# Patient Record
Sex: Female | Born: 1965 | Race: Black or African American | Hispanic: No | Marital: Married | Smoking: Never smoker
Health system: Southern US, Community
[De-identification: ages and names within clinical notes are randomized; demographics above are authoritative.]

## PROBLEM LIST (undated history)

## (undated) DIAGNOSIS — Z9079 Acquired absence of other genital organ(s): Secondary | ICD-10-CM

## (undated) DIAGNOSIS — N898 Other specified noninflammatory disorders of vagina: Secondary | ICD-10-CM

## (undated) DIAGNOSIS — Z90722 Acquired absence of ovaries, bilateral: Secondary | ICD-10-CM

## (undated) DIAGNOSIS — D649 Anemia, unspecified: Secondary | ICD-10-CM

## (undated) DIAGNOSIS — Z90710 Acquired absence of both cervix and uterus: Secondary | ICD-10-CM

## (undated) DIAGNOSIS — N939 Abnormal uterine and vaginal bleeding, unspecified: Secondary | ICD-10-CM

## (undated) HISTORY — PX: CHOLECYSTECTOMY: SHX55

## (undated) HISTORY — DX: Acquired absence of other genital organ(s): Z90.79

## (undated) HISTORY — DX: Anemia, unspecified: D64.9

## (undated) HISTORY — DX: Abnormal uterine and vaginal bleeding, unspecified: N93.9

## (undated) HISTORY — DX: Acquired absence of both cervix and uterus: Z90.710

## (undated) HISTORY — DX: Acquired absence of ovaries, bilateral: Z90.722

## (undated) HISTORY — DX: Other specified noninflammatory disorders of vagina: N89.8

---

## 2000-03-27 ENCOUNTER — Emergency Department (HOSPITAL_COMMUNITY): Admission: EM | Admit: 2000-03-27 | Discharge: 2000-03-28 | Payer: Self-pay | Admitting: Emergency Medicine

## 2000-03-28 ENCOUNTER — Encounter: Payer: Self-pay | Admitting: Emergency Medicine

## 2000-10-23 ENCOUNTER — Emergency Department (HOSPITAL_COMMUNITY): Admission: EM | Admit: 2000-10-23 | Discharge: 2000-10-23 | Payer: Self-pay | Admitting: Emergency Medicine

## 2000-11-24 ENCOUNTER — Ambulatory Visit: Admission: RE | Admit: 2000-11-24 | Discharge: 2000-11-24 | Payer: Self-pay | Admitting: General Surgery

## 2001-08-04 ENCOUNTER — Encounter: Admission: RE | Admit: 2001-08-04 | Discharge: 2001-08-04 | Payer: Self-pay | Admitting: General Surgery

## 2001-08-04 ENCOUNTER — Encounter: Payer: Self-pay | Admitting: General Surgery

## 2005-02-21 ENCOUNTER — Emergency Department (HOSPITAL_COMMUNITY): Admission: EM | Admit: 2005-02-21 | Discharge: 2005-02-21 | Payer: Self-pay | Admitting: Emergency Medicine

## 2005-03-12 ENCOUNTER — Emergency Department: Payer: Self-pay | Admitting: General Practice

## 2005-05-12 ENCOUNTER — Ambulatory Visit: Payer: Self-pay | Admitting: Family Medicine

## 2008-05-15 ENCOUNTER — Ambulatory Visit: Payer: Self-pay

## 2010-10-06 ENCOUNTER — Encounter: Payer: Self-pay | Admitting: Family Medicine

## 2012-03-12 ENCOUNTER — Emergency Department: Payer: Self-pay | Admitting: Emergency Medicine

## 2012-03-12 LAB — BASIC METABOLIC PANEL
Anion Gap: 6 — ABNORMAL LOW (ref 7–16)
BUN: 19 mg/dL — ABNORMAL HIGH (ref 7–18)
Calcium, Total: 8.3 mg/dL — ABNORMAL LOW (ref 8.5–10.1)
Chloride: 106 mmol/L (ref 98–107)
Co2: 26 mmol/L (ref 21–32)
Creatinine: 0.75 mg/dL (ref 0.60–1.30)
EGFR (African American): >60
EGFR (Non-African Amer.): >60
Glucose: 76 mg/dL (ref 65–99)
Osmolality: 277 (ref 275–301)
Potassium: 3.7 mmol/L (ref 3.5–5.1)
Sodium: 138 mmol/L (ref 136–145)

## 2012-03-12 LAB — CBC
HCT: 27.1 % — ABNORMAL LOW (ref 35.0–47.0)
HGB: 8.1 g/dL — ABNORMAL LOW (ref 12.0–16.0)
MCH: 20 pg — ABNORMAL LOW (ref 26.0–34.0)
MCHC: 29.9 g/dL — ABNORMAL LOW (ref 32.0–36.0)
MCV: 67 fL — ABNORMAL LOW (ref 80–100)
Platelet: 300 10*3/uL (ref 150–440)
RBC: 4.06 10*6/uL (ref 3.80–5.20)
RDW: 17.8 % — ABNORMAL HIGH (ref 11.5–14.5)
WBC: 3.2 10*3/uL — ABNORMAL LOW (ref 3.6–11.0)

## 2012-03-12 LAB — LIPASE, BLOOD: Lipase: 115 U/L (ref 73–393)

## 2012-03-12 LAB — TROPONIN I: Troponin-I: <0.02 ng/mL

## 2013-11-11 IMAGING — CT CT CHEST W/ CM
1 series · 15 of 33 positions shown, 19 images · non-contrast
Comparison: none

REASON FOR EXAM: + d dimer
COMMENTS:

[Series 5: soft tissue · axial · 0.69mm/px · z∈[-216,+38]mm · 15 of 101 slices shown, 19 images]
[im 8/101  mediastinal]
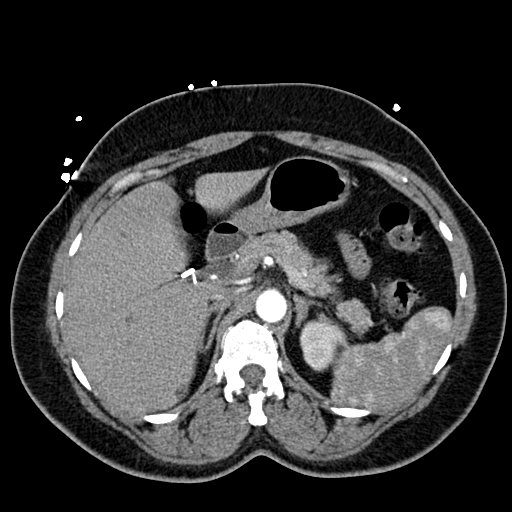
[im 8/101  lung]
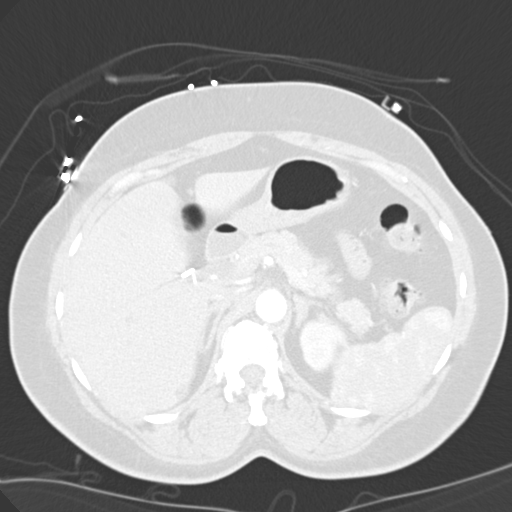
[im 15/101  lung]
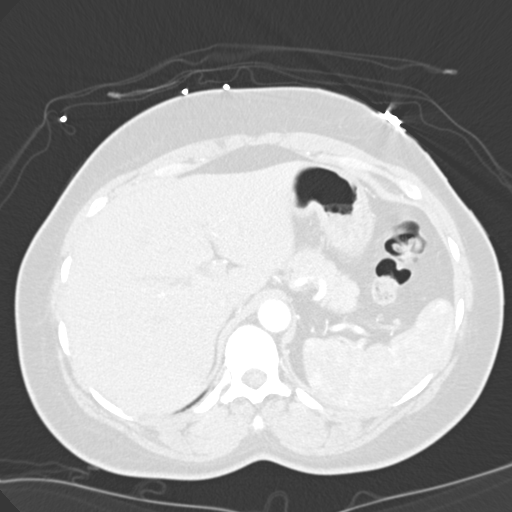
[im 21/101  lung]
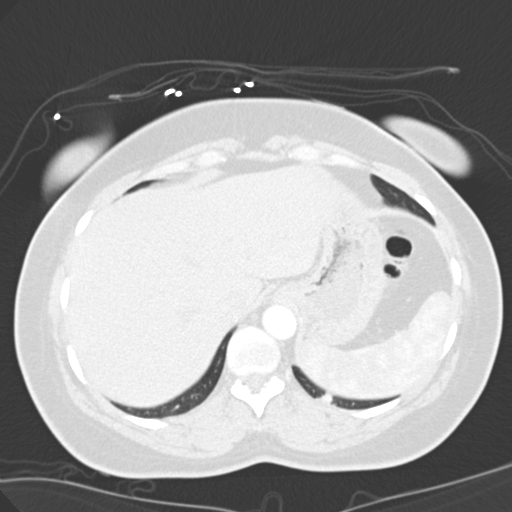
[im 26/101  lung]
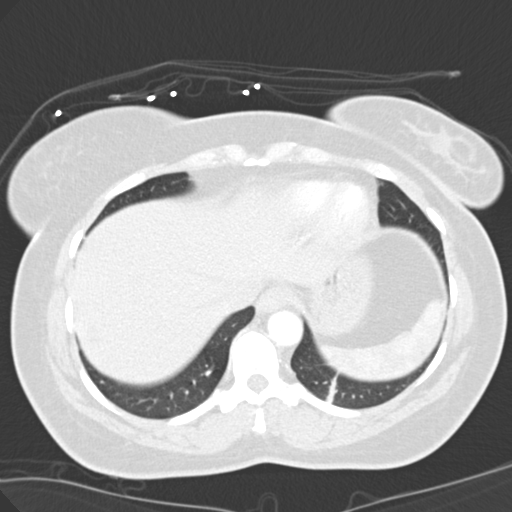
[im 34/101  mediastinal]
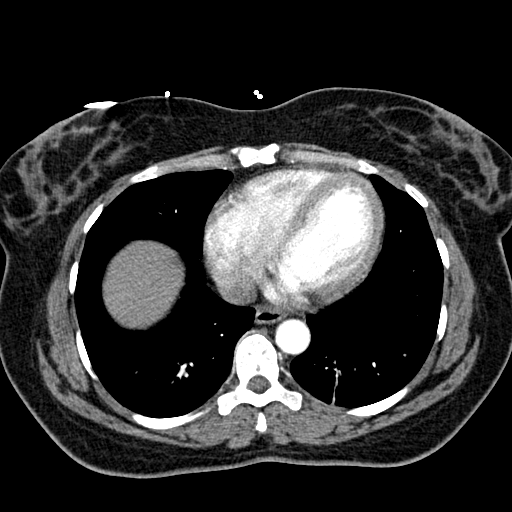
[im 34/101  lung]
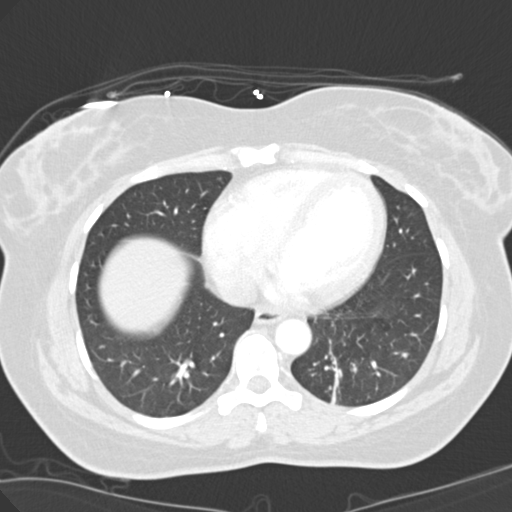
[im 41/101  lung]
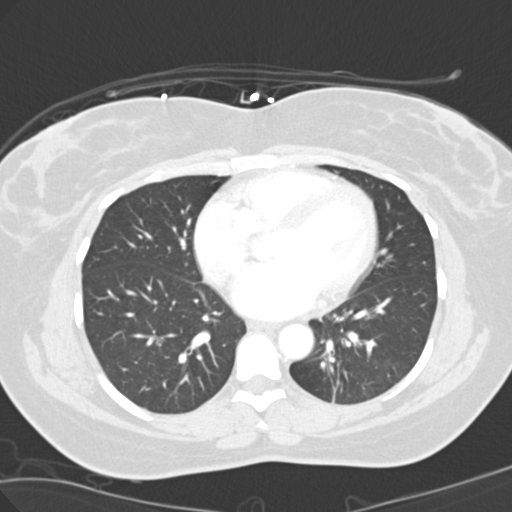
[im 48/101  lung]
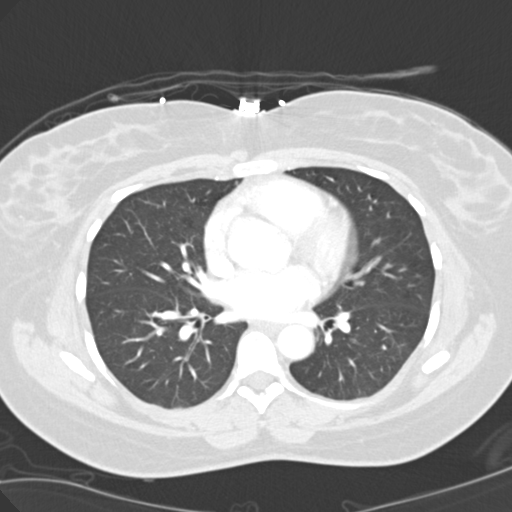
[im 52/101  lung]
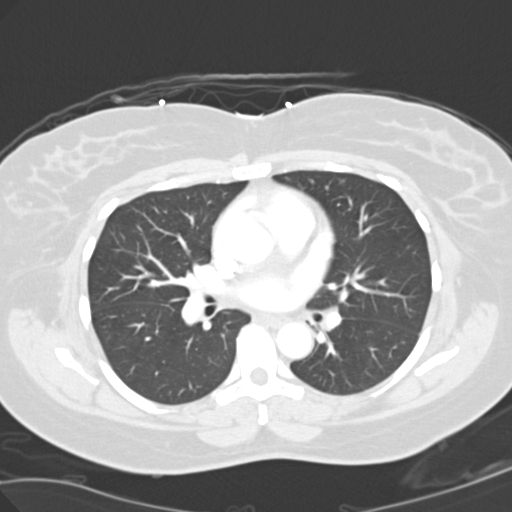
[im 56/101  mediastinal]
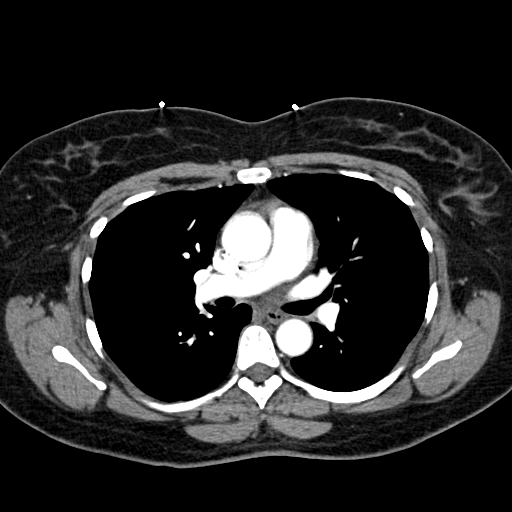
[im 56/101  lung]
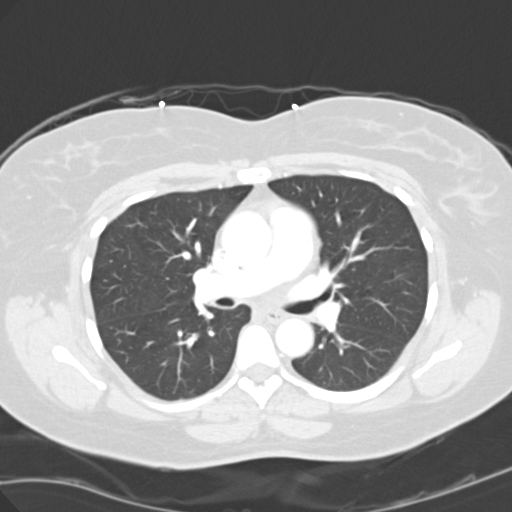
[im 61/101  lung]
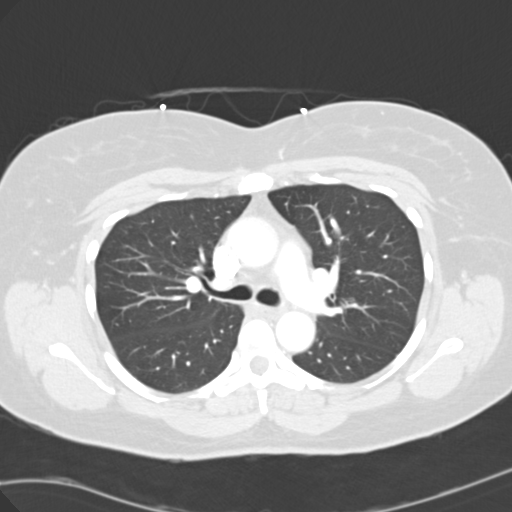
[im 67/101  lung]
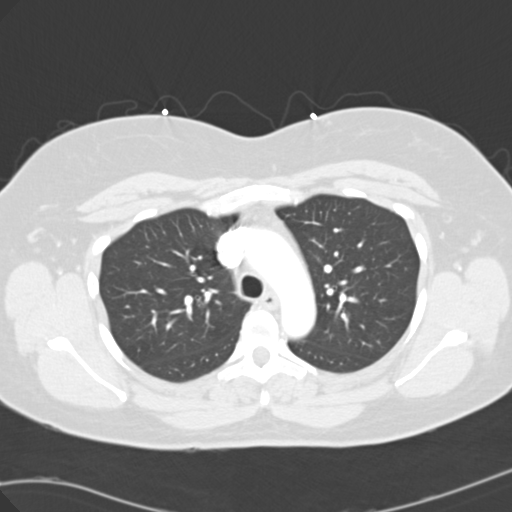
[im 75/101  lung]
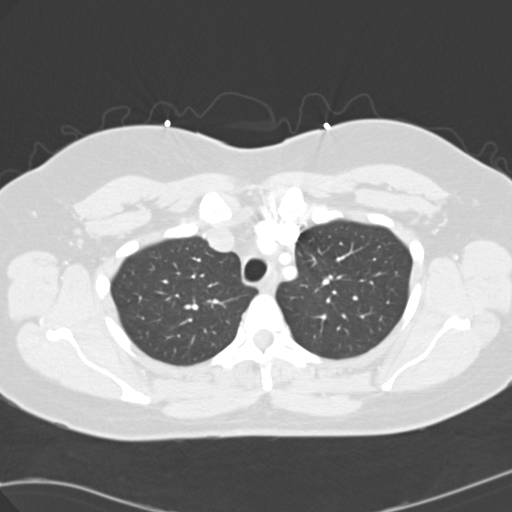
[im 81/101  mediastinal]
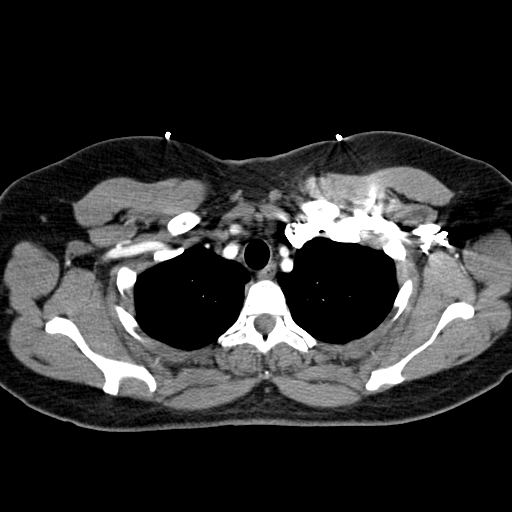
[im 81/101  lung]
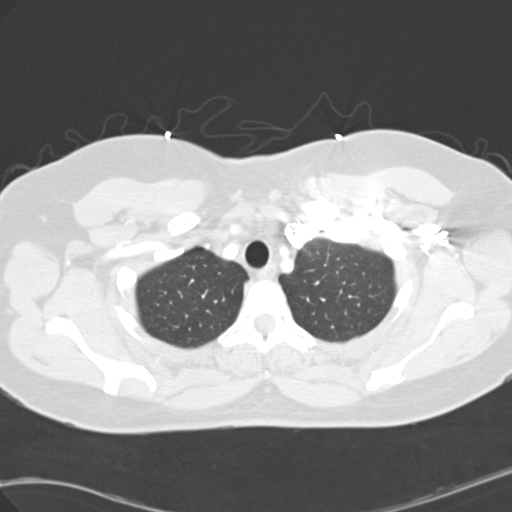
[im 86/101  lung]
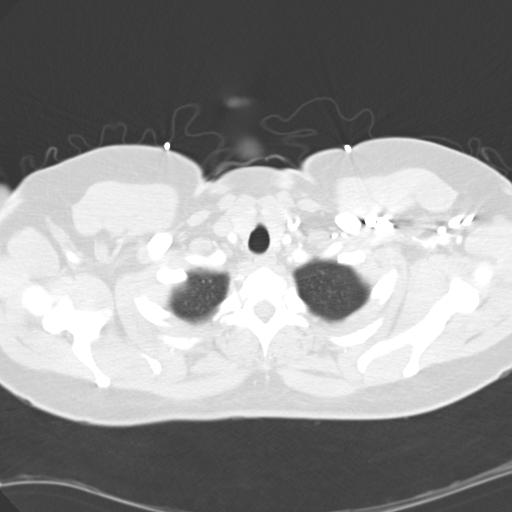
[im 93/101  lung]
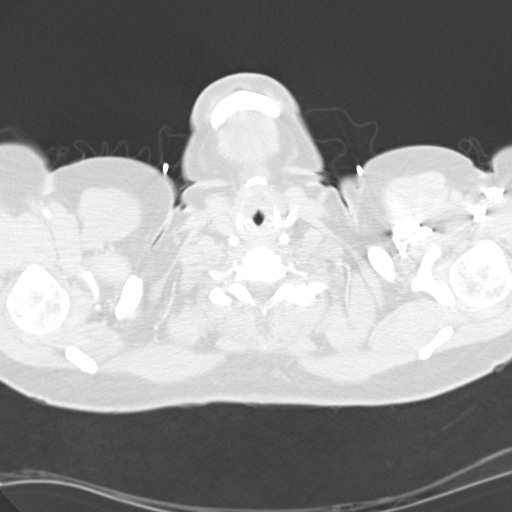

[15 of 33 positions shown; findings below may reference images not displayed]

PROCEDURE:     CT  - CT CHEST (FOR PE) W  - March 12, 2012  [DATE]

RESULT:     Axial CT scanning was performed through the chest with
reconstructions at 3 mm intervals and slice thicknesses. Review of
multiplanar reconstructed images was performed separately on the VIA
monitor. The patient received 75 cc of [id number]. Comparison is made to a
prior study dated 15 May, 2008.

At mediastinal window settings the cardiac chambers are borderline enlarged.
The caliber of the thoracic aorta is normal. Contrast within the pulmonary
arterial tree is normal in appearance. I see no filling defects to suggest
an acute pulmonary embolism. There are no pathologic sized mediastinal or
hilar lymph nodes. The thoracic esophagus is normal in caliber. There is no
pleural nor pericardial effusion.

At lung window settings there is new linear density in the posterior
costophrenic gutter on the left. I see no alveolar infiltrates. No pulmonary
parenchymal masses are demonstrated.

Within the upper abdomen the observed portions of the liver and spleen and
adrenal glands exhibit no acute abnormality. There are stable hypodensities
in the right hepatic lobe most compatible with cysts. The thoracic vertebral
bodies are preserved in height.
IMPRESSION: 1. I do not see evidence of an acute pulmonary embolism. There is linear
atelectatic change in the left lower lobe posteriorly which is new.
2. There is no evidence of CHF nor of pneumonia. I see no mediastinal or
hilar lymphadenopathy nor evidence of pleural effusions.
3. There are stable hypodensities in the liver most compatible with cysts.

[REDACTED]

## 2014-02-19 ENCOUNTER — Inpatient Hospital Stay: Payer: Self-pay | Admitting: Obstetrics and Gynecology

## 2014-02-19 LAB — COMPREHENSIVE METABOLIC PANEL
Albumin: 3.2 g/dL — ABNORMAL LOW (ref 3.4–5.0)
Alkaline Phosphatase: 34 U/L — ABNORMAL LOW
Anion Gap: 6 — ABNORMAL LOW (ref 7–16)
BUN: 13 mg/dL (ref 7–18)
Bilirubin,Total: 0.3 mg/dL (ref 0.2–1.0)
Calcium, Total: 8 mg/dL — ABNORMAL LOW (ref 8.5–10.1)
Chloride: 106 mmol/L (ref 98–107)
Co2: 27 mmol/L (ref 21–32)
Creatinine: 0.71 mg/dL (ref 0.60–1.30)
EGFR (African American): >60
EGFR (Non-African Amer.): >60
Glucose: 102 mg/dL — ABNORMAL HIGH (ref 65–99)
Osmolality: 278 (ref 275–301)
Potassium: 3.3 mmol/L — ABNORMAL LOW (ref 3.5–5.1)
SGOT(AST): 12 U/L — ABNORMAL LOW (ref 15–37)
SGPT (ALT): 23 U/L (ref 12–78)
Sodium: 139 mmol/L (ref 136–145)
Total Protein: 6.8 g/dL (ref 6.4–8.2)

## 2014-02-19 LAB — CBC
HCT: 16 % — ABNORMAL LOW (ref 35.0–47.0)
HGB: 4.5 g/dL — CL (ref 12.0–16.0)
MCH: 16.5 pg — ABNORMAL LOW (ref 26.0–34.0)
MCHC: 28.2 g/dL — ABNORMAL LOW (ref 32.0–36.0)
MCV: 59 fL — ABNORMAL LOW (ref 80–100)
Platelet: 274 10*3/uL (ref 150–440)
RBC: 2.72 10*6/uL — ABNORMAL LOW (ref 3.80–5.20)
RDW: 22.3 % — ABNORMAL HIGH (ref 11.5–14.5)
WBC: 3 10*3/uL — ABNORMAL LOW (ref 3.6–11.0)

## 2014-02-19 LAB — HCG, QUANTITATIVE, PREGNANCY: Beta Hcg, Quant.: <1 m[IU]/mL — ABNORMAL LOW

## 2014-02-19 LAB — PROTIME-INR
INR: 0.9
Prothrombin Time: 12.5 secs (ref 11.5–14.7)

## 2014-02-20 LAB — HEMOGLOBIN
HGB: 5 g/dL — CL (ref 12.0–16.0)
HGB: 5.6 g/dL — AB (ref 12.0–16.0)
HGB: 7.7 g/dL — ABNORMAL LOW (ref 12.0–16.0)

## 2014-02-20 LAB — URINALYSIS, COMPLETE
Bacteria: NONE SEEN
Bilirubin,UR: NEGATIVE
Glucose,UR: NEGATIVE mg/dL (ref 0–75)
KETONE: NEGATIVE
LEUKOCYTE ESTERASE: NEGATIVE
NITRITE: NEGATIVE
Ph: 6 (ref 4.5–8.0)
Protein: NEGATIVE
RBC,UR: 13 /HPF (ref 0–5)
SPECIFIC GRAVITY: 1.014 (ref 1.003–1.030)
Squamous Epithelial: 1
WBC UR: 3 /HPF (ref 0–5)

## 2014-02-21 LAB — HEMOGLOBIN: HGB: 7.1 g/dL — ABNORMAL LOW (ref 12.0–16.0)

## 2014-02-21 LAB — BASIC METABOLIC PANEL
Anion Gap: 3 — ABNORMAL LOW (ref 7–16)
BUN: 11 mg/dL (ref 7–18)
CHLORIDE: 111 mmol/L — AB (ref 98–107)
CREATININE: 0.65 mg/dL (ref 0.60–1.30)
Calcium, Total: 7.8 mg/dL — ABNORMAL LOW (ref 8.5–10.1)
Co2: 26 mmol/L (ref 21–32)
Glucose: 86 mg/dL (ref 65–99)
Osmolality: 278 (ref 275–301)
POTASSIUM: 3.9 mmol/L (ref 3.5–5.1)
Sodium: 140 mmol/L (ref 136–145)

## 2014-02-21 LAB — GC/CHLAMYDIA PROBE AMP

## 2014-02-27 LAB — HM PAP SMEAR: HM PAP: NEGATIVE

## 2014-04-02 ENCOUNTER — Ambulatory Visit: Payer: Self-pay | Admitting: Obstetrics and Gynecology

## 2014-04-02 DIAGNOSIS — D508 Other iron deficiency anemias: Secondary | ICD-10-CM

## 2014-04-02 LAB — CBC WITH DIFFERENTIAL/PLATELET
BASOS ABS: 0.1 10*3/uL (ref 0.0–0.1)
Basophil %: 2 %
EOS ABS: 0 10*3/uL (ref 0.0–0.7)
EOS PCT: 1.5 %
HCT: 33.4 % — AB (ref 35.0–47.0)
HGB: 10.8 g/dL — ABNORMAL LOW (ref 12.0–16.0)
LYMPHS PCT: 40.8 %
Lymphocyte #: 1.2 10*3/uL (ref 1.0–3.6)
MCH: 28.2 pg (ref 26.0–34.0)
MCHC: 32.4 g/dL (ref 32.0–36.0)
MCV: 87 fL (ref 80–100)
Monocyte #: 0.2 x10 3/mm (ref 0.2–0.9)
Monocyte %: 7.1 %
NEUTROS PCT: 48.6 %
Neutrophil #: 1.4 10*3/uL (ref 1.4–6.5)
Platelet: 274 10*3/uL (ref 150–440)
RBC: 3.83 10*6/uL (ref 3.80–5.20)
RDW: 23.7 % — ABNORMAL HIGH (ref 11.5–14.5)
WBC: 3 10*3/uL — ABNORMAL LOW (ref 3.6–11.0)

## 2014-04-02 LAB — BASIC METABOLIC PANEL
ANION GAP: 2 — AB (ref 7–16)
BUN: 23 mg/dL — ABNORMAL HIGH (ref 7–18)
CREATININE: 0.76 mg/dL (ref 0.60–1.30)
Calcium, Total: 8.7 mg/dL (ref 8.5–10.1)
Chloride: 109 mmol/L — ABNORMAL HIGH (ref 98–107)
Co2: 29 mmol/L (ref 21–32)
EGFR (Non-African Amer.): >60
GLUCOSE: 84 mg/dL (ref 65–99)
Osmolality: 282 (ref 275–301)
Potassium: 4.3 mmol/L (ref 3.5–5.1)
SODIUM: 140 mmol/L (ref 136–145)

## 2014-04-08 ENCOUNTER — Inpatient Hospital Stay: Payer: Self-pay | Admitting: Obstetrics and Gynecology

## 2014-04-09 LAB — CREATININE, SERUM: CREATININE: 0.74 mg/dL (ref 0.60–1.30)

## 2014-04-09 LAB — HEMOGLOBIN: HGB: 10.4 g/dL — ABNORMAL LOW (ref 12.0–16.0)

## 2014-04-10 HISTORY — PX: ABDOMINAL HYSTERECTOMY: SHX81

## 2014-04-11 LAB — PATHOLOGY REPORT

## 2014-04-11 LAB — URINE CULTURE

## 2015-03-01 NOTE — Op Note (Signed)
PATIENT NAME:  Makayla DredgeKIMBLE, Garrie MR#:  [id number] DATE OF BIRTH:  [birth year]  DATE OF PROCEDURE:  04/08/2014  PREOPERATIVE DIAGNOSES: 1.  Perimenopausal menorrhagia. 2.  Anemia. 3.  Fibroid uterus.    POSTOPERATIVE DIAGNOSES: 1.  Perimenopausal menorrhagia. 2.  Anemia. 3.  Fibroid uterus.    OPERATION:  Total abdominal hysterectomy with bilateral salpingo-oophorectomy.   ANESTHESIA:  General endotracheal.    SURGEON:  Mackenna Kamer S. Valentino Saxonherry, MD  ASSISTANT:  Prentice DockerMartin A. DeFrancesco, MD; Alfredo MartinezJustin Ollis, PA student.  EBL:  250 mL.  OPERATIVE FLUIDS:  1350 mL of lactated Ringer's; Urine output 175 ml.    COMPLICATIONS:  None.  FINDINGS:  A large leiomyomatous uterus par 20 cm, normal-appearing bilateral fallopian tubes and ovaries, small 2 cm simple left ovarian cyst.  SPECIMENS:  Uterus, bilateral tubes and ovaries.    PROCEDURE:  The patient was taken to the operating room where she was placed under general anesthesia without difficulty.  She was then placed in the supine position and prepped and draped in the normal sterile fashion.  After this a Foley catheter was placed.    An infraumbilical midline incision was made anterior to the subcutaneous fascia.  Fascial incision was made and extended vertically.  The rectus muscles were then dissected off of 1 side.  The peritoneum was then identified and entered and the peritoneal incision was extended longitudinally.  The above findings were noted.  The uterus was delivered from the abdomen and bowel was packed away from the surgical site.  The round ligaments were identified, cut and ligated with 3-0 Vicryl.  Anterior peritoneal reflection was incised and the bladder was dissected off the lower uterine segment.  The retroperitoneal space was then explored and ureters were able to be identified bilaterally.  The right infundibulopelvic ligament was grasped, cut and suture ligated with 0 Vicryl.  The left infundibulopelvic ligament was then grasped, cut  and suggesting ligated with 0 Vicryl in a similar fashion.  Hemostasis was observed.  The uterine vessels were then skeletonized, clamped, cut and suture ligated with 0 Vicryl suture.  Serial pedicles of the cardinal and uterosacral ligaments were clamped, cut and suture ligated with 0 Vicryl on each side.  Entrance was then made into the vagina and the uterus was removed.  Vaginal cuff angled sutures were then placed incorporating the uterosacral ligaments for support.  The vaginal cuff was then closed with several figure-of-eight sutures of 0 Vicryl and lavage was carried out until clear.  Hemostasis was observed.  Surgicel was also placed on the  vaginal cuff for added hemostasis.    All packing was removed from the abdomen.  The fascia was approximated with two running sutures of 0 Maxon.  Lavage was then again carried out and hemostasis was observed.  The subcutaneous fat tissue layer was closed with a 2-0 Vicryl in a running fashion.  The skin was then approximated with staples.  A Lidoderm patch was then placed over the incision.  Instrument, sponge and needle counts were correct prior to abdominal closure and at the conclusion of the case.  The patient was sent to the PACU in stable condition.    ____________________________ Jacques EarthlyAnika S. Valentino Saxonherry, MD asc:cs D: 04/09/2014 19:43:00 ET T: 04/09/2014 20:15:19 ET JOB#: [id number]  cc: Jacques EarthlyAnika S. Valentino Saxonherry, MD, <Dictator> Fabian NovemberANIKA S Shandelle Borrelli MD ELECTRONICALLY SIGNED 04/22/2014 10:28

## 2015-07-29 ENCOUNTER — Encounter: Payer: Self-pay | Admitting: Obstetrics and Gynecology

## 2015-09-03 ENCOUNTER — Encounter: Payer: Self-pay | Admitting: Obstetrics and Gynecology

## 2015-09-03 ENCOUNTER — Ambulatory Visit (INDEPENDENT_AMBULATORY_CARE_PROVIDER_SITE_OTHER): Payer: Self-pay | Admitting: Obstetrics and Gynecology

## 2015-09-03 VITALS — BP 127/84 | HR 87 | Resp 16 | Ht 69.0 in | Wt 215.6 lb

## 2015-09-03 DIAGNOSIS — N950 Postmenopausal bleeding: Secondary | ICD-10-CM

## 2015-09-03 DIAGNOSIS — Z1239 Encounter for other screening for malignant neoplasm of breast: Secondary | ICD-10-CM

## 2015-09-03 DIAGNOSIS — E669 Obesity, unspecified: Secondary | ICD-10-CM

## 2015-09-03 DIAGNOSIS — N951 Menopausal and female climacteric states: Secondary | ICD-10-CM

## 2015-09-03 DIAGNOSIS — Z01419 Encounter for gynecological examination (general) (routine) without abnormal findings: Secondary | ICD-10-CM

## 2015-09-03 MED ORDER — ESTROGENS CONJUGATED 0.625 MG PO TABS: 0.6250 mg | ORAL_TABLET | Freq: Every day | ORAL | Status: AC

## 2015-09-03 NOTE — Progress Notes (Signed)
GYNECOLOGY ANNUAL PHYSICAL EXAM  Subjective:    Makayla Rios is a 49 y.o. female who presents for annual exam. The patient has no complaints today. The patient is sexually active. GYN screening history: last pap: approximate date 02/2014 and was normal and last mammogram: approximate date 08/2014 and was normal. The patient is not taking hormone replacement therapy. Patient reports post-menopausal vaginal bleeding (spotting). Vaginal bleeding has been going on for 2 months, is intermittent, bright red. Bleeding is not associated with pain. Also reports hot flushes, night sweats, and vaginal dryness. The patient wears seatbelts: yes. The patient participates in regular exercise: no. Has the patient ever been transfused or tattooed?: yes. The patient reports that there is not domestic violence in her life.   Menstrual History: OB History  Gravida Para Term Preterm AB SAB TAB Ectopic Multiple Living  5 5 5       5     # Outcome Date GA Lbr Len/2nd Weight Sex Delivery Anes PTL Lv  5 Term      Vag-Spont   Y  4 Term      Vag-Spont   Y  3 Term      Vag-Spont   Y  2 Term      Vag-Spont   Y  1 Term      Vag-Spont   Y      Menarche age: [age] No LMP recorded. Patient has had a hysterectomy.  Colonoscopy performed 01/2015, normal.   Past Medical History  Diagnosis Date  . Anemia     Past Surgical History  Procedure Laterality Date  . Cholecystectomy    . Abdominal hysterectomy  04/10/2014    with bso    Family History  Problem Relation Age of Onset  . Diabetes Mother   . Cancer Neg Hx   . Heart disease Neg Hx     Social History   Social History  . Marital Status: Married    Spouse Name: N/A  . Number of Children: N/A  . Years of Education: N/A   Occupational History  . Not on file.   Social History Main Topics  . Smoking status: Never Smoker   . Smokeless tobacco: Not on file  . Alcohol Use: Yes  . Drug Use: No  . Sexual Activity: Yes    Birth Control/ Protection: Surgical    Other Topics Concern  . Not on file   Social History Narrative   Outpatient Encounter Prescriptions as of 09/03/2015  Medication Sig Note  . Multiple Vitamin (MULTIVITAMIN) capsule Take by mouth. 09[facility]03/2015[facility]   No facility-administered encounter medications on file as of 09/03/2015.    No Known Allergies   Review of Systems  Constitutional: positive for hot flushes and night sweats; negative for chills, fatigue, fevers Eyes: negative for irritation, redness and visual disturbance Ears, nose, mouth, throat, and face: negative for hearing loss, nasal congestion, snoring and tinnitus Respiratory: negative for asthma, cough, sputum Cardiovascular: negative for chest pain, dyspnea, exertional chest pressure/discomfort, irregular heart beat, palpitations and syncope Gastrointestinal: negative for abdominal pain, change in bowel habits, nausea and vomiting Genitourinary: positive for vaginal dryness, vaginal bleeding; negative for abnormal menstrual periods, genital lesions, sexual problems and vaginal discharge, dysuria and urinary incontinence Integument/breast: negative for breast lump, breast tenderness and nipple discharge Hematologic/lymphatic: negative for bleeding and easy bruising Musculoskeletal:negative for back pain and muscle weakness Neurological: negative for dizziness, headaches, vertigo and weakness Endocrine: negative for diabetic symptoms including polydipsia,  polyuria and skin dryness Allergic/Immunologic: negative for hay fever and urticaria    Objective:    BP 127/84 mmHg  Pulse 87  Resp 16  Ht  (1.753 m)  Wt 215 lb 9.6 oz (97.796 kg)  BMI 31.82 kg/m2  General Appearance:    Alert, cooperative, no distress, appears stated age  Head:    Normocephalic, without obvious abnormality, atraumatic  Eyes:    PERRL, conjunctiva/corneas clear, EOM's intact, both eyes  Ears:    Normal external ear canals, both ears   Nose:   Nares normal, septum midline, mucosa normal, no drainage or sinus tenderness  Throat:   Lips, mucosa, and tongue normal; teeth and gums normal  Neck:   Supple, symmetrical, trachea midline, no adenopathy;  thyroid: no enlargement/tenderness/nodules; no carotid bruit or JVD  Back:     Symmetric, no curvature, ROM normal, no CVA tenderness  Lungs:     Clear to auscultation bilaterally, respirations unlabored  Chest Wall:    No tenderness or deformity   Heart:    Regular rate and rhythm, S1 and S2 normal, no murmur, rub   or gallop  Breast Exam:    No tenderness, masses, or nipple abnormality. Thickening of axillary tissue bilaterally.   Abdomen:     Soft, non-tender, bowel sounds active all four quadrants, no masses, no organomegaly.  Well healed infraumbilical vertical midline incision.  Genitalia:    Female genitalia: Bladder no bladder distension noted Urethra: not indicated and normal appearing urethra with no masses, tenderness or lesions Vulva: normal appearing vulva with no masses, tenderness or lesions Vagina: atrophic and with small area of granulation tissue noted at left apical segment of vagina, friable Cervix: surgically absent Uterus: surgically absent, vaginal cuff well healed Adnexa: surgically absent bilateral  Rectal:    Normal external sphincter and tone.  No hemorrhoids.  Internal exam not performed.   Extremities:   Extremities normal, atraumatic, no cyanosis or edema  Pulses:   2+ and symmetric all extremities  Skin:   Skin color, texture, turgor normal, no rashes or lesions  Lymph nodes:   Cervical, supraclavicular, and axillary nodes normal  Neurologic:   CNII-XII intact, normal strength, sensation and reflexes    throughout      Last Labs:  Lab Results  Component Value Date   WBC 3.0* 04/02/2014   HGB 10.4* 04/09/2014   HCT 33.4* 04/02/2014   MCV 87 04/02/2014   PLT 274 04/02/2014     Chemistry      Component Value Date/Time   NA 140 04/02/2014  1139   K 4.3 04/02/2014 1139   CL 109* 04/02/2014 1139   CO2 29 04/02/2014 1139   BUN 23* 04/02/2014 1139   CREATININE 0.74 04/09/2014 0527      Component Value Date/Time   CALCIUM 8.7 04/02/2014 1139   ALKPHOS 34* 02/19/2014 1528   AST 12* 02/19/2014 1528   ALT 23 02/19/2014 1528   BILITOT 0.3 02/19/2014 1528      02/2014:  TSH - 0.709 (normal) Lipid panel - normal HgbA1c 6.0 (slightly elevated)   Assessment:    Normal gyn exam Menopause Post-menopausal vaginal bleeding   Obesity Class 1  H/o anemia (secondary to fibroid uterus and perimenopausal bleeding, now s/p hysterectomy)  Plan:    Breast self exam technique reviewed and patient encouraged to perform self-exam monthly. Discussed healthy lifestyle modifications. Mammogram.   Labs ordered: Lipid panel, CBC, HgbA1c, and BMP.  Treated area of granulation tissue with silver  nitrate stick. Should have no further bleeding.  Vasomotor symptoms and vaginal atrophy - Patient with bothersome menopausal vasomotor symptoms. Discussed lifestyle interventions such as wearing light clothing, remaining in cool environments, having fan/air conditioner in the room, avoiding hot beverages etc.  Discussed using hormone therapy and concerns about increased risk of heart disease, cerebrovascular disease, thromboembolic disease,  and breast cancer.  Also discussed other medical options such as Paxil, Effexor or Neurontin.   Also discussed alternative therapies such as herbal remedies but cautioned that most of the products contained phytoestrogens (plant estrogens) in unregulated amounts which can have the same effects on the body as the pharmaceutical estrogen preparations. Patient opted for estrogen therapy for now, wants to try the oral formulation.  Premarin 0.625 mg daily prescribed.  She will return in 3 months for reevaluation. To f/u in 1 year for annual exam.   Hildred Laser, MD [facility]

## 2015-09-04 LAB — CBC
Hematocrit: 38 % (ref 34.0–46.6)
Hemoglobin: 12.7 g/dL (ref 11.1–15.9)
MCH: 29.4 pg (ref 26.6–33.0)
MCHC: 33.4 g/dL (ref 31.5–35.7)
MCV: 88 fL (ref 79–97)
PLATELETS: 260 10*3/uL (ref 150–379)
RBC: 4.32 x10E6/uL (ref 3.77–5.28)
RDW: 13.1 % (ref 12.3–15.4)
WBC: 3.1 10*3/uL — AB (ref 3.4–10.8)

## 2015-09-04 LAB — BASIC METABOLIC PANEL
BUN / CREAT RATIO: 20 (ref 9–23)
BUN: 15 mg/dL (ref 6–24)
CO2: 26 mmol/L (ref 18–29)
CREATININE: 0.74 mg/dL (ref 0.57–1.00)
Calcium: 9.3 mg/dL (ref 8.7–10.2)
Chloride: 100 mmol/L (ref 97–106)
GFR calc Af Amer: 108 mL/min/{1.73_m2} (ref >59)
GFR, EST NON AFRICAN AMERICAN: 94 mL/min/{1.73_m2} (ref >59)
GLUCOSE: 102 mg/dL — AB (ref 65–99)
POTASSIUM: 4.2 mmol/L (ref 3.5–5.2)
SODIUM: 141 mmol/L (ref 136–144)

## 2015-09-04 LAB — LIPID PANEL
Chol/HDL Ratio: 2.4 ratio units (ref 0.0–4.4)
Cholesterol, Total: 205 mg/dL — ABNORMAL HIGH (ref 100–199)
HDL: 85 mg/dL (ref >39)
LDL CALC: 109 mg/dL — AB (ref 0–99)
Triglycerides: 57 mg/dL (ref 0–149)
VLDL Cholesterol Cal: 11 mg/dL (ref 5–40)

## 2015-09-04 LAB — HEMOGLOBIN A1C
Est. average glucose Bld gHb Est-mCnc: 114 mg/dL
Hgb A1c MFr Bld: 5.6 % (ref 4.8–5.6)

## 2015-10-22 IMAGING — US TRANSABDOMINAL ULTRASOUND OF PELVIS
1 series · 13 of 25 positions shown · non-contrast
Comparison: None.

CLINICAL DATA: Abnormal uterine bleeding.

EXAM:
TRANSABDOMINAL ULTRASOUND OF PELVIS
TECHNIQUE: Transabdominal ultrasound examination of the pelvis was performed
including evaluation of the uterus, ovaries, adnexal regions, and
pelvic cul-de-sac.

[Series 1: transabdominal ultrasound of pelvis · 0.27mm/px · 13 of 129 slices shown]
[im 1/129]
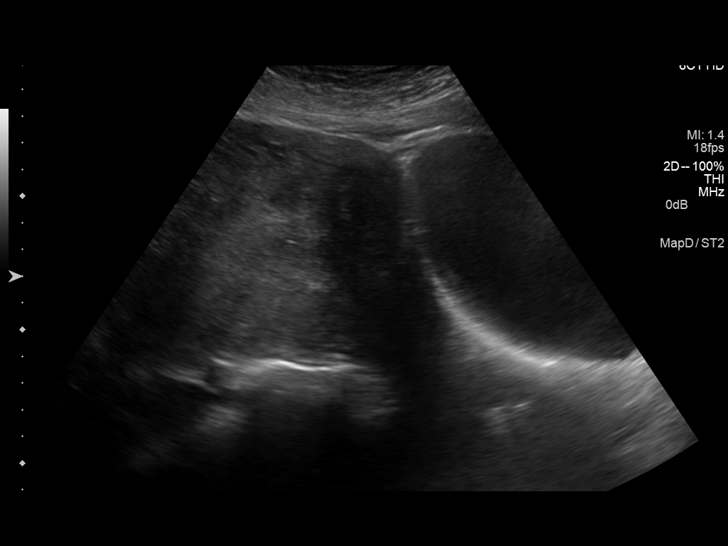
[im 11/129]
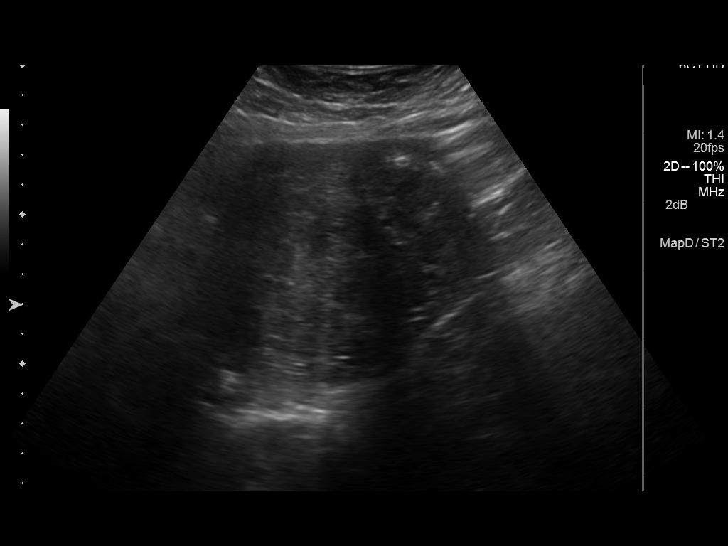
[im 22/129]
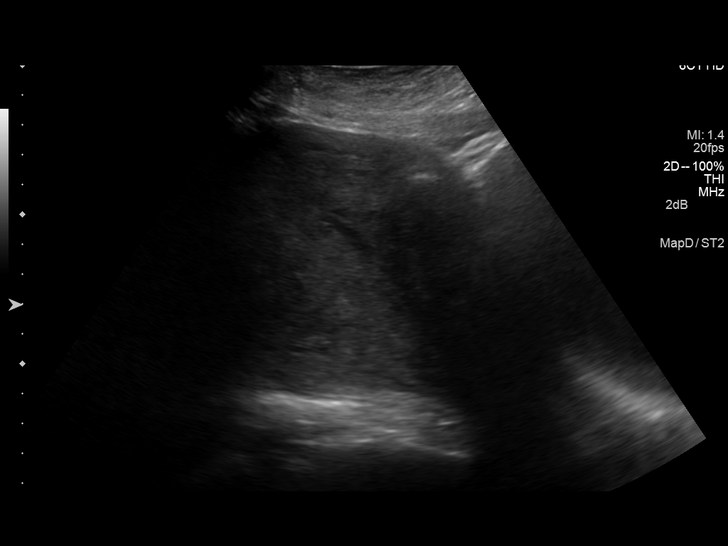
[im 33/129]
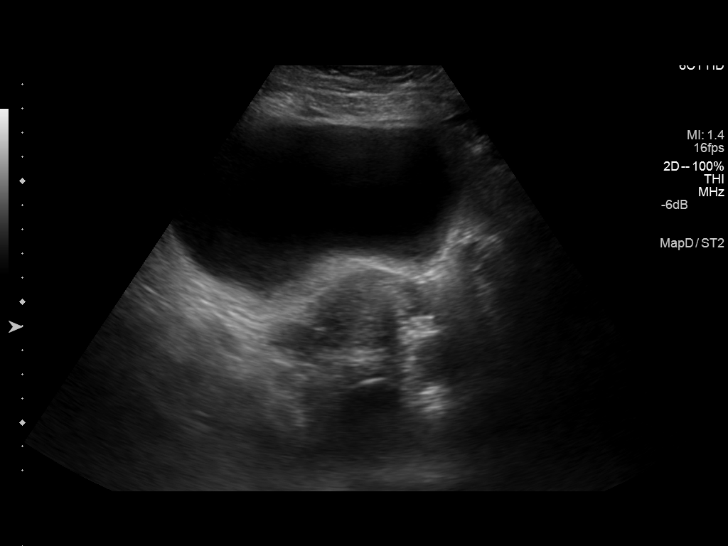
[im 43/129]
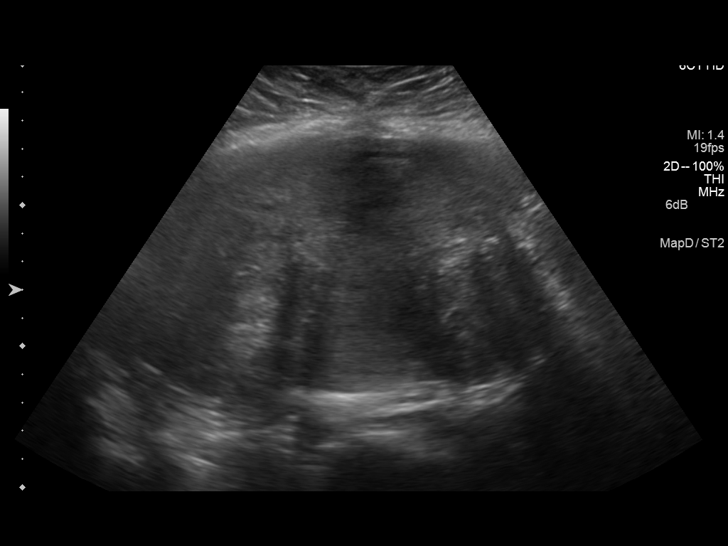
[im 54/129]
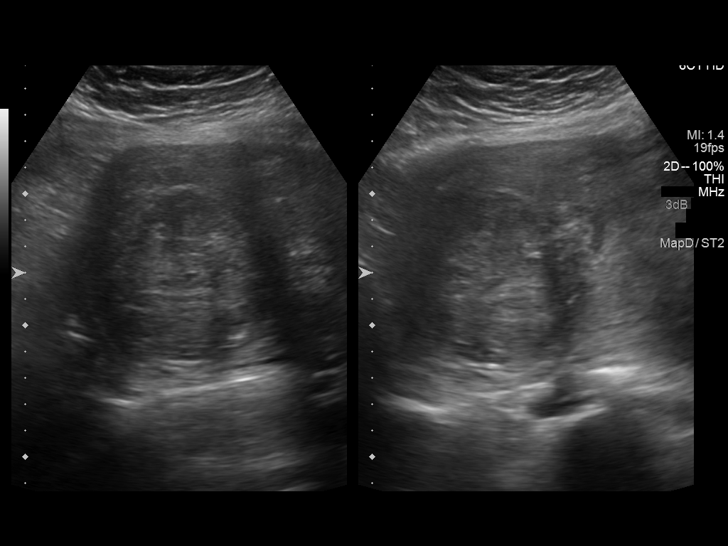
[im 65/129]
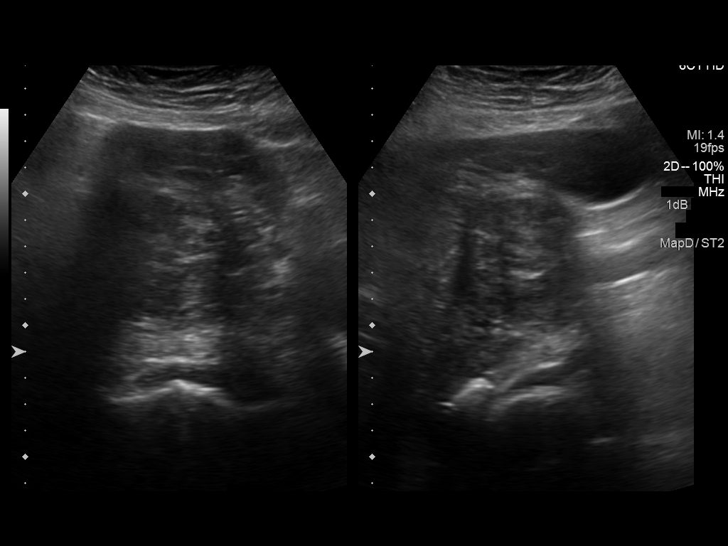
[im 75/129]
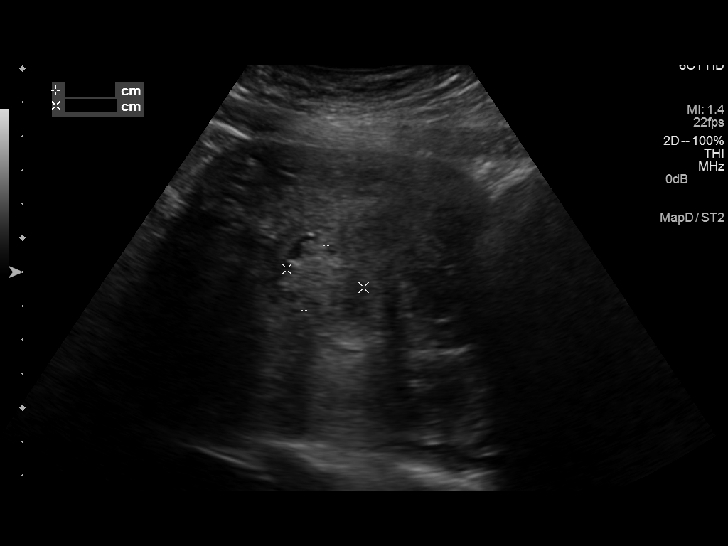
[im 86/129]
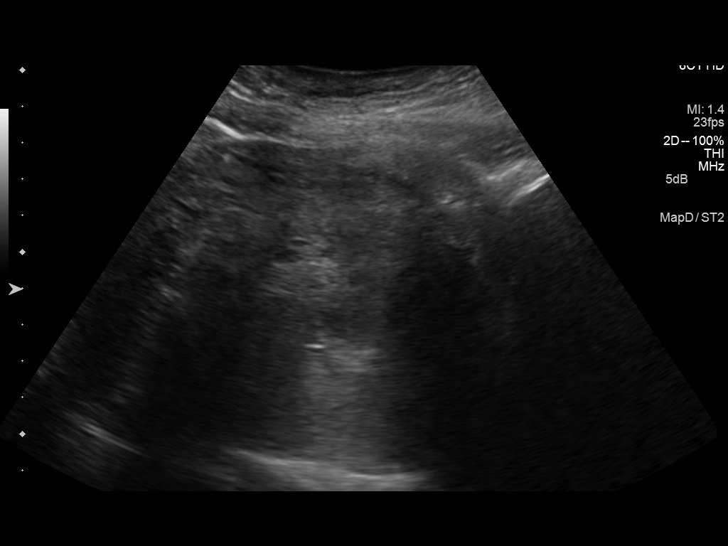
[im 97/129]
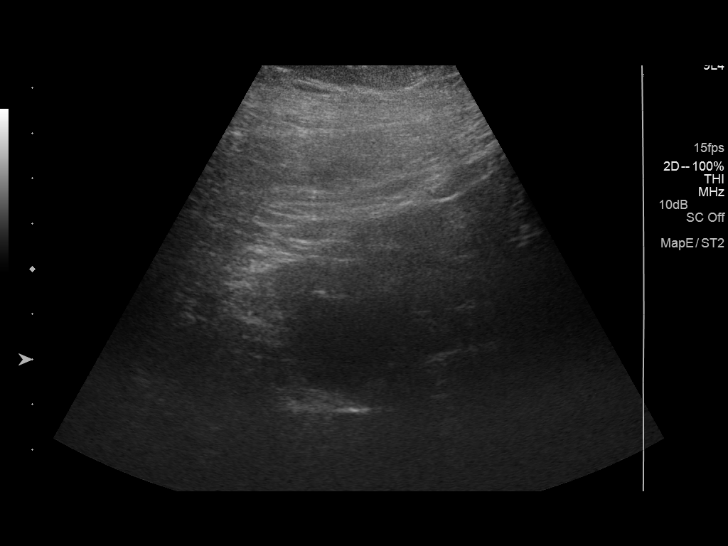
[im 107/129]
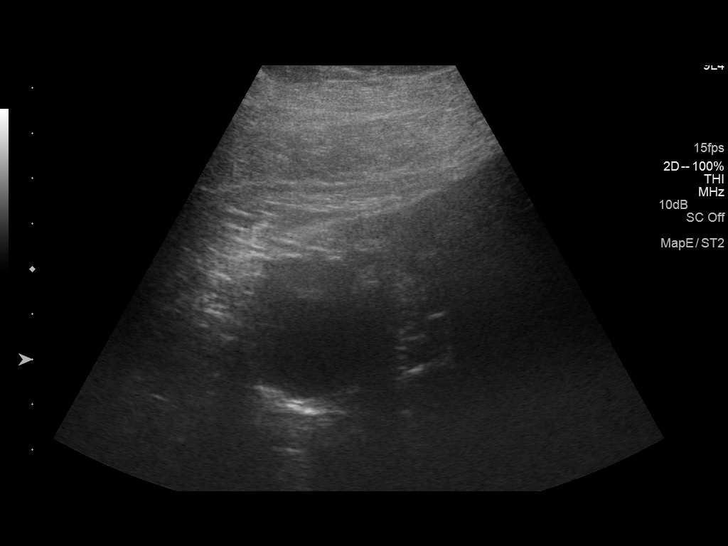
[im 118/129]
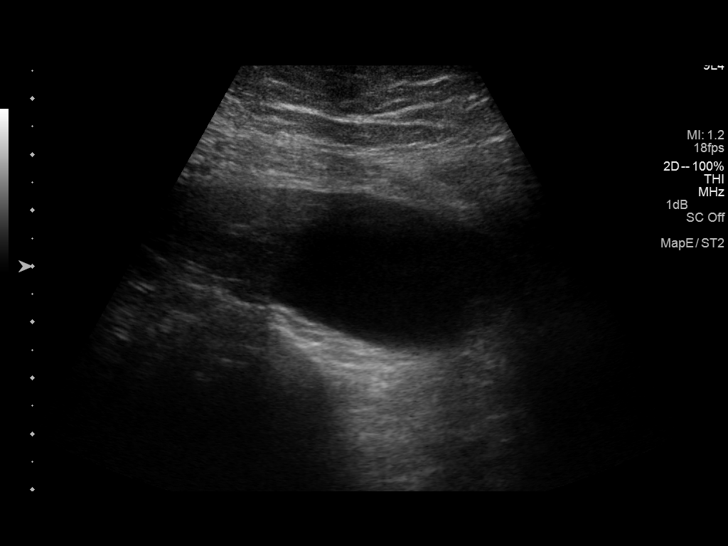
[im 129/129]
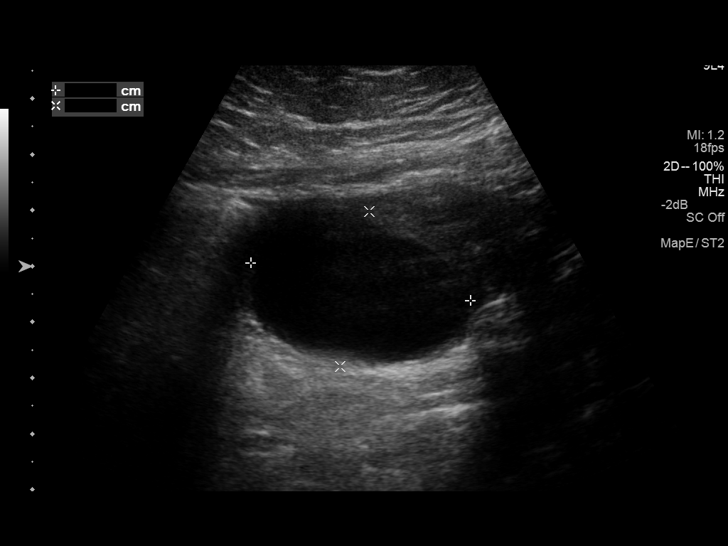

[13 of 25 positions shown; findings below may reference images not displayed]

FINDINGS: Uterus

Measurements: 21 x 10 x 18 cm. Multiple heterogeneous fibroids
throughout the uterus, some partially calcified. The largest fibroid
on the right measures up to 7 cm. The largest fibroid on the left
measures up to 6 cm..

Endometrium

Thickness: 3 mm in thickness where visualized.. Hyperechoic area
centrally near the endometrium measures 2.3 x 2.0 x 2.0 cm. This
could represent a submucosal fibroid or clot although endometrial
polyp cannot be excluded.

Right ovary

Measurements: 3.1 x 2.6 x 3.2 cm.  2.4 cm benign-appearing cyst..

Left ovary

Measurements: 5.1 x 3.2 x 5.0 cm. 4.0 cm simple appearing left
ovarian cyst

Other findings:  No free fluid.

Prominent venous structures in the left adnexa compatible with
gonadal vein reflux/varicocele.

The patient declined endovaginal imaging.
IMPRESSION: Enlarged uterus with multiple fibroids, the largest measuring up to
7 cm.

Irregular hyperechoic area centrally near the endometrium measuring
up to 2.3 cm. This could represent a submucosal fibroid, endometrial
clot or polyp. The patient declined transvaginal imaging. This could
be followed with repeat pelvic ultrasound in 6-8 weeks which should
include transvaginal imaging. If this persists on transvaginal
imaging, this may require sonohysterogram.

Small bilateral benign appearing ovarian cysts.

Large/prominent veins in the left adnexal region compatible with
gonadal vein reflux/varicocele.

## 2015-12-04 ENCOUNTER — Ambulatory Visit (INDEPENDENT_AMBULATORY_CARE_PROVIDER_SITE_OTHER): Payer: BLUE CROSS/BLUE SHIELD | Admitting: Obstetrics and Gynecology

## 2015-12-04 ENCOUNTER — Ambulatory Visit: Payer: Self-pay | Admitting: Obstetrics and Gynecology

## 2015-12-24 ENCOUNTER — Ambulatory Visit: Payer: BLUE CROSS/BLUE SHIELD | Admitting: Obstetrics and Gynecology

## 2016-01-07 ENCOUNTER — Ambulatory Visit: Payer: BLUE CROSS/BLUE SHIELD | Admitting: Obstetrics and Gynecology
